# Patient Record
Sex: Male | Born: 1980 | Hispanic: Yes | Marital: Married | Smoking: Never smoker
Health system: Southern US, Community
[De-identification: ages and names within clinical notes are randomized; demographics above are authoritative.]

## PROBLEM LIST (undated history)

## (undated) DIAGNOSIS — Z789 Other specified health status: Secondary | ICD-10-CM

## (undated) HISTORY — PX: INGUINAL HERNIA REPAIR: SHX194

## (undated) HISTORY — DX: Other specified health status: Z78.9

## (undated) HISTORY — PX: NO PAST SURGERIES: SHX2092

---

## 2017-08-08 ENCOUNTER — Other Ambulatory Visit: Payer: Self-pay

## 2017-08-08 ENCOUNTER — Emergency Department (INDEPENDENT_AMBULATORY_CARE_PROVIDER_SITE_OTHER)
Admission: EM | Admit: 2017-08-08 | Discharge: 2017-08-08 | Disposition: A | Discharge status: Home / Self Care | Payer: Managed Care, Other (non HMO) | Source: Home / Self Care | Attending: Family Medicine | Admitting: Family Medicine

## 2017-08-08 ENCOUNTER — Encounter: Payer: Self-pay | Admitting: *Deleted

## 2017-08-08 DIAGNOSIS — J069 Acute upper respiratory infection, unspecified: Secondary | ICD-10-CM

## 2017-08-08 DIAGNOSIS — B9789 Other viral agents as the cause of diseases classified elsewhere: Secondary | ICD-10-CM | POA: Diagnosis not present

## 2017-08-08 MED ORDER — BENZONATATE 200 MG PO CAPS: ORAL_CAPSULE | ORAL | 0 refills | Status: DC

## 2017-08-08 MED ORDER — AZITHROMYCIN 250 MG PO TABS: ORAL_TABLET | ORAL | 0 refills | Status: DC

## 2017-08-08 NOTE — Discharge Instructions (Signed)
Take plain guaifenesin (1200mg  extended release tabs such as Mucinex) twice daily, with plenty of water, for cough and congestion.  May add Pseudoephedrine (30mg , one or two every 4 to 6 hours) for sinus congestion.  Get adequate rest.   May use Afrin nasal spray (or generic oxymetazoline) each morning for about 5 days and then discontinue.  Also recommend using saline nasal spray several times daily and saline nasal irrigation (AYR is a common brand).  Use Flonase nasal spray each morning after using Afrin nasal spray and saline nasal irrigation. Try warm salt water gargles for sore throat.  Stop all antihistamines for now, and other non-prescription cough/cold preparations. May take Ibuprofen 200mg , 4 tabs every 8 hours with food for headache, body aches, fever, etc. Begin Azithromycin if not improving about one week or if persistent fever develops (Given a prescription to hold, with an expiration date)  Follow-up with family doctor if not improving about10 days.

## 2017-08-08 NOTE — ED Provider Notes (Signed)
[facility]    CSN: [id number] Arrival date & time: 08/08/17  1225     History   Chief Complaint Chief Complaint  Patient presents with  . Cough    HPI Victor Sherman is a 36 y.o. male.   Yesterday patient developed typical cold-like symptoms including mild sore throat, sinus congestion, headache, fatigue, chills, and cough.    The history is provided by the patient.    History reviewed. No pertinent past medical history.  There are no active problems to display for this patient.   History reviewed. No pertinent surgical history.     Home Medications    Prior to Admission medications   Medication Sig Start Date End Date Taking? Authorizing Provider  azithromycin (ZITHROMAX Z-PAK) 250 MG tablet Take 2 tabs today; then begin one tab once daily for 4 more days. (Rx void after 08/16/17). 08/08/17   Lattie HawBeese, Stephen A, MD  benzonatate (TESSALON) 200 MG capsule Take one cap by mouth at bedtime as needed for cough.  May repeat in 4 to 6 hours 08/08/17   Lattie HawBeese, Stephen A, MD    Family History History reviewed. No pertinent family history.  Social History Social History   Tobacco Use  . Smoking status: Never Smoker  . Smokeless tobacco: Never Used  Substance Use Topics  . Alcohol use: Yes    Comment: 2-3 q wk  . Drug use: No     Allergies   Patient has no known allergies.   Review of Systems Review of Systems + sore throat + cough No pleuritic pain No wheezing + nasal congestion + post-nasal drainage No sinus pain/pressure No itchy/red eyes No earache No hemoptysis No SOB No fever, + chills No nausea No vomiting No abdominal pain No diarrhea No urinary symptoms No skin rash + fatigue No myalgias No headache Used OTC meds without relief   Physical Exam Triage Vital Signs ED Triage Vitals  Enc Vitals Group     BP 08/08/17 1253 117/70     Pulse Rate 08/08/17 1253 70     Resp 08/08/17 1253 16     Temp 08/08/17 1253 97.7  F (36.5 C)     Temp Source 08/08/17 1253 Oral     SpO2 08/08/17 1253 100 %     Weight 08/08/17 1254 177 lb (80.3 kg)     Height 08/08/17 1254 5\' 7"  (1.702 m)     Head Circumference --      Peak Flow --      Pain Score 08/08/17 1254 0     Pain Loc --      Pain Edu? --      Excl. in [place]? --    No data found.  Updated Vital Signs BP 117/70 (BP Location: Left Arm)   Pulse 70   Temp 97.7 F (36.5 C) (Oral)   Resp 16   Ht 5\' 7"  (1.702 m)   Wt 177 lb (80.3 kg)   SpO2 100%   BMI 27.72 kg/m   Visual Acuity Right Eye Distance:   Left Eye Distance:   Bilateral Distance:    Right Eye Near:   Left Eye Near:    Bilateral Near:     Physical Exam Nursing notes and Vital Signs reviewed. Appearance:  Patient appears stated age, and in no acute distress Eyes:  Pupils are equal, round, and reactive to light and accomodation.  Extraocular movement is intact.  Conjunctivae are not inflamed  Ears:  Canals normal.  Tympanic membranes normal.  Nose:  Mildly congested turbinates.  No sinus tenderness.    Pharynx:  Normal Neck:  Supple.  Enlarged posterior/lateral nodes are palpated bilaterally, tender to palpation on the left.   Lungs:  Clear to auscultation.  Breath sounds are equal.  Moving air well. Heart:  Regular rate and rhythm without murmurs, rubs, or gallops.  Abdomen:  Nontender without masses or hepatosplenomegaly.  Bowel sounds are present.  No CVA or flank tenderness.  Extremities:  No edema.  Skin:  No rash present.    UC Treatments / Results  Labs (all labs ordered are listed, but only abnormal results are displayed) Labs Reviewed - No data to display  EKG  EKG Interpretation None       Radiology No results found.  Procedures Procedures (including critical care time)  Medications Ordered in UC Medications - No data to display   Initial Impression / Assessment and Plan / UC Course  I have reviewed the triage vital signs and the nursing notes.  Pertinent  labs & imaging results that were available during my care of the patient were reviewed by me and considered in my medical decision making (see chart for details).    There is no evidence of bacterial infection today.  Prescription written for Benzonatate [facility](Tessalon) to take at bedtime for night-time cough.  Take plain guaifenesin (1200mg  extended release tabs such as Mucinex) twice daily, with plenty of water, for cough and congestion.  May add Pseudoephedrine (30mg , one or two every 4 to 6 hours) for sinus congestion.  Get adequate rest.   May use Afrin nasal spray (or generic oxymetazoline) each morning for about 5 days and then discontinue.  Also recommend using saline nasal spray several times daily and saline nasal irrigation (AYR is a common brand).  Use Flonase nasal spray each morning after using Afrin nasal spray and saline nasal irrigation. Try warm salt water gargles for sore throat.  Stop all antihistamines for now, and other non-prescription cough/cold preparations. May take Ibuprofen 200mg , 4 tabs every 8 hours with food for headache, body aches, fever, etc. Begin Azithromycin if not improving about one week or if persistent fever develops (Given a prescription to hold, with an expiration date)  Follow-up with family doctor if not improving about10 days.     Final Clinical Impressions(s) / UC Diagnoses   Final diagnoses:  Viral URI with cough    ED Discharge Orders        Ordered    azithromycin (ZITHROMAX Z-PAK) 250 MG tablet     08/08/17 1319    benzonatate (TESSALON) 200 MG capsule     08/08/17 1320           Lattie HawBeese, Stephen A, MD 08/11/17 1119

## 2017-08-08 NOTE — ED Triage Notes (Signed)
Pt c/o productive cough x 2 days.  

## 2019-02-13 ENCOUNTER — Encounter: Payer: Self-pay | Admitting: Sports Medicine

## 2019-02-13 ENCOUNTER — Other Ambulatory Visit: Payer: Self-pay

## 2019-02-13 ENCOUNTER — Ambulatory Visit (INDEPENDENT_AMBULATORY_CARE_PROVIDER_SITE_OTHER): Payer: Managed Care, Other (non HMO)

## 2019-02-13 ENCOUNTER — Ambulatory Visit (INDEPENDENT_AMBULATORY_CARE_PROVIDER_SITE_OTHER): Payer: Managed Care, Other (non HMO) | Admitting: Sports Medicine

## 2019-02-13 ENCOUNTER — Telehealth: Payer: Self-pay | Admitting: General Practice

## 2019-02-13 DIAGNOSIS — M25474 Effusion, right foot: Secondary | ICD-10-CM

## 2019-02-13 MED ORDER — MELOXICAM 15 MG PO TABS: ORAL_TABLET | ORAL | 3 refills | Status: DC

## 2019-02-13 MED ORDER — PREDNISONE 50 MG PO TABS: ORAL_TABLET | ORAL | 0 refills | Status: DC

## 2019-02-13 NOTE — Progress Notes (Signed)
Subjective:    CC: New patient visit with the below complaints as noted in HPI: right foot pain  HPI: Patient presents with 4 days of right foot pain located at the first MTP joint which began after taking a misstep. The pain is 8 out of 10, stabbing and constant when he walks or moves his toes and is decreased at rest. He has tried an icy-hot patch which helped with pain minimally. He denies fever or chills. He has never experienced this type of pain before in this area or any other joint. He admits eating a lot of meat before this happened, but denies heavy alcohol use.    I reviewed the past medical history, family history, social history, surgical history, and allergies today and no changes were needed.  Please see the problem list section below in epic for further details.  Past Medical History: Past Medical History:  Diagnosis Date  . No pertinent past medical history    Past Surgical History: Past Surgical History:  Procedure Laterality Date  . NO PAST SURGERIES     Social History: Social History   Socioeconomic History  . Marital status: Married    Spouse name: Not on file  . Number of children: Not on file  . Years of education: Not on file  . Highest education level: Not on file  Occupational History  . Not on file  Social Needs  . Financial resource strain: Not on file  . Food insecurity    Worry: Not on file    Inability: Not on file  . Transportation needs    Medical: Not on file    Non-medical: Not on file  Tobacco Use  . Smoking status: Never Smoker  . Smokeless tobacco: Never Used  Substance and Sexual Activity  . Alcohol use: Yes    Comment: 2-3 q wk  . Drug use: No  . Sexual activity: Not on file  Lifestyle  . Physical activity    Days per week: Not on file    Minutes per session: Not on file  . Stress: Not on file  Relationships  . Social Musicianconnections    Talks on phone: Not on file    Gets together: Not on file    Attends religious service:  Not on file    Active member of club or organization: Not on file    Attends meetings of clubs or organizations: Not on file    Relationship status: Not on file  Other Topics Concern  . Not on file  Social History Narrative  . Not on file   Family History: No family history on file. Allergies: No Known Allergies Medications: See med rec.  Review of Systems: No headache, visual changes, nausea, vomiting, diarrhea, constipation, dizziness, abdominal pain, skin rash, fevers, chills, night sweats, swollen lymph nodes, weight loss, chest pain, body aches, joint swelling, muscle aches, shortness of breath, mood changes, visual or auditory hallucinations.  Objective:    General: Well Developed, well nourished, and in no acute distress.  Neuro: Alert and oriented x3, extra-ocular muscles intact, sensation grossly intact.  HEENT: Normocephalic, atraumatic, pupils equal round reactive to light, neck supple, no masses, no lymphadenopathy, thyroid nonpalpable.  Skin: Warm and dry, no rashes noted.  Cardiac: Regular rate and rhythm, no murmurs rubs or gallops.  Respiratory: Clear to auscultation bilaterally. Not using accessory muscles, speaking in full sentences.  Abdominal: Soft, nontender, nondistended, positive bowel sounds, no masses, no organomegaly.  Right Foot: Mild erythema and swelling  proximal to first MTP. Moderate tenderness to palpation in this area. No tenderness at other MTP joints or phalanges, tarsals, or metatarsals.  Range of motion is full in all directions. Strength is 5/5 in all directions. No hallux valgus. No pes cavus or pes planus. No abnormal callus noted. No pain over the navicular prominence, or base of fifth metatarsal. No tenderness to palpation of the calcaneal insertion of plantar fascia. No pain at the Achilles insertion. No pain over the calcaneal bursa. No pain of the retrocalcaneal bursa. No hallux rigidus or limitus. No pain with compression of the  metatarsal heads. Neurovascularly intact distally.     Impression and Recommendations:    Assessment: Pain/swelling of first MTP joint of the right foot. Plan: Radiographs of R foot, Prednisone x 5 days followed by Meloxicam once/day, post-op shoe, serum uric acid levels. Will return in 1-2 weeks to re-evaluate.   The patient was counselled, risk factors were discussed, anticipatory guidance given.  Swelling of first metatarsophalangeal (MTP) joint of right foot Mild trauma, but also swelling in the MTP with a fairly large consumption of meat prior suspicious for gout. Adding serum uric acid levels, x-rays, postop shoe. Prednisone, meloxicam. Return to see me in 1 to 2 weeks.   ___________________________________________ Gwen Her. Dianah Field, M.D., ABFM., CAQSM. Primary Care and Sports Medicine [facility] [facility]  Adjunct Professor of [place] [employer]

## 2019-02-13 NOTE — Telephone Encounter (Signed)
Note written

## 2019-02-13 NOTE — Telephone Encounter (Signed)
Patient is needing a note for work. He has missed two days because of the injury. (6/21-22)  Please Advise.   When done we can email to patient.

## 2019-02-13 NOTE — Assessment & Plan Note (Signed)
Mild trauma, but also swelling in the MTP with a fairly large consumption of meat prior suspicious for gout. Adding serum uric acid levels, x-rays, postop shoe. Prednisone, meloxicam. Return to see me in 1 to 2 weeks.

## 2019-02-14 LAB — COMPLETE METABOLIC PANEL WITH GFR
AG Ratio: 1.6 (calc) (ref 1.0–2.5)
ALT: 37 U/L (ref 9–46)
AST: 28 U/L (ref 10–40)
Albumin: 4.1 g/dL (ref 3.6–5.1)
Alkaline phosphatase (APISO): 77 U/L (ref 36–130)
BUN: 11 mg/dL (ref 7–25)
CO2: 25 mmol/L (ref 20–32)
Calcium: 8.8 mg/dL (ref 8.6–10.3)
Chloride: 108 mmol/L (ref 98–110)
Creat: 0.74 mg/dL (ref 0.60–1.35)
GFR, Est African American: 135 mL/min/{1.73_m2} (ref >=60)
GFR, Est Non African American: 116 mL/min/{1.73_m2} (ref >=60)
Globulin: 2.5 g/dL (calc) (ref 1.9–3.7)
Glucose, Bld: 92 mg/dL (ref 65–139)
Potassium: 3.9 mmol/L (ref 3.5–5.3)
Sodium: 140 mmol/L (ref 135–146)
Total Bilirubin: 0.3 mg/dL (ref 0.2–1.2)
Total Protein: 6.6 g/dL (ref 6.1–8.1)

## 2019-02-14 LAB — URIC ACID: Uric Acid, Serum: 4.5 mg/dL (ref 4.0–8.0)

## 2019-02-27 ENCOUNTER — Ambulatory Visit: Payer: Managed Care, Other (non HMO) | Admitting: Sports Medicine

## 2019-11-26 ENCOUNTER — Emergency Department (INDEPENDENT_AMBULATORY_CARE_PROVIDER_SITE_OTHER)
Admission: EM | Admit: 2019-11-26 | Discharge: 2019-11-26 | Disposition: A | Discharge status: Home / Self Care | Payer: Managed Care, Other (non HMO) | Source: Home / Self Care | Attending: Family Medicine | Admitting: Family Medicine

## 2019-11-26 ENCOUNTER — Emergency Department (INDEPENDENT_AMBULATORY_CARE_PROVIDER_SITE_OTHER): Payer: Managed Care, Other (non HMO)

## 2019-11-26 ENCOUNTER — Other Ambulatory Visit: Payer: Self-pay

## 2019-11-26 DIAGNOSIS — S46912A Strain of unspecified muscle, fascia and tendon at shoulder and upper arm level, left arm, initial encounter: Secondary | ICD-10-CM

## 2019-11-26 DIAGNOSIS — T1490XA Injury, unspecified, initial encounter: Secondary | ICD-10-CM

## 2019-11-26 MED ORDER — ACETAMINOPHEN 325 MG PO TABS
650.0000 mg | ORAL_TABLET | Freq: Once | ORAL | Status: AC
  Administered 2019-11-26: 12:00:00 650 mg via ORAL

## 2019-11-26 NOTE — Discharge Instructions (Addendum)
Wear ace wrap on elbow until improved.  Apply ice pack for 20 to 30 minutes, 3 to 4 times daily  Continue until pain and swelling decrease.  May take Ibuprofen 200mg , 4 tabs every 8 hours with food.
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
## 2019-11-26 NOTE — ED Notes (Signed)
Ice pack given after triage.
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
## 2021-06-26 ENCOUNTER — Ambulatory Visit (INDEPENDENT_AMBULATORY_CARE_PROVIDER_SITE_OTHER): Payer: BC Managed Care – PPO | Admitting: Sports Medicine

## 2021-06-26 ENCOUNTER — Other Ambulatory Visit: Payer: Self-pay

## 2021-06-26 ENCOUNTER — Ambulatory Visit (INDEPENDENT_AMBULATORY_CARE_PROVIDER_SITE_OTHER): Payer: BC Managed Care – PPO

## 2021-06-26 DIAGNOSIS — M79674 Pain in right toe(s): Secondary | ICD-10-CM | POA: Diagnosis not present

## 2021-06-26 DIAGNOSIS — M79671 Pain in right foot: Secondary | ICD-10-CM

## 2021-06-26 DIAGNOSIS — M2011 Hallux valgus (acquired), right foot: Secondary | ICD-10-CM | POA: Diagnosis not present

## 2021-06-26 MED ORDER — MELOXICAM 15 MG PO TABS: ORAL_TABLET | ORAL | 3 refills | Status: DC

## 2021-06-26 NOTE — Progress Notes (Signed)
    Procedures performed today:    None.  Independent interpretation of notes and tests performed by another provider:   None.  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 He does have a very mild bunion. Tenderness is predominantly over the TMT, I did see him about 2 years ago and we check some uric acid levels which were normal. We will start conservatively, postop shoe, referral for custom orthotics with first MT ray posting, meloxicam, x-rays. We did talk about the limitations of bunion spacers, they do not affect the natural history of the bunion but can improve his pain to some degree.  He will probably get one off of [employer]. Return to see me in 2 to 4 weeks, will consider Morton's plate +/- TMT injection if no better.    ___________________________________________ Ihor Austin. Benjamin Stain, M.D., ABFM., CAQSM. Primary Care and Sports Medicine [place] [facility]  Adjunct Instructor of Family Medicine  [employer]

## 2021-06-26 NOTE — Assessment & Plan Note (Addendum)
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 He does have a very mild bunion. Tenderness is predominantly over the TMT, I did see him about 2 years ago and we check some uric acid levels which were normal. We will start conservatively, postop shoe, referral for custom orthotics with first MT ray posting, meloxicam, x-rays. We did talk about the limitations of bunion spacers, they do not affect the natural history of the bunion but can improve his pain to some degree.  He will probably get one off of [employer]. Return to see me in 2 to 4 weeks, will consider Morton's plate +/- TMT injection if no better.

## 2021-07-24 ENCOUNTER — Ambulatory Visit: Payer: BC Managed Care – PPO | Admitting: Sports Medicine

## 2021-07-24 ENCOUNTER — Ambulatory Visit: Payer: BC Managed Care – PPO | Admitting: Family Medicine

## 2021-08-28 ENCOUNTER — Encounter: Payer: BC Managed Care – PPO | Admitting: Family Medicine

## 2022-06-12 IMAGING — DX DG FOOT COMPLETE 3+V*R*
3 series · 3 of 3 positions shown · non-contrast
Comparison: None.

CLINICAL DATA: First toe pain

EXAM:
RIGHT FOOT COMPLETE - 3+ VIEW

[foot ap]
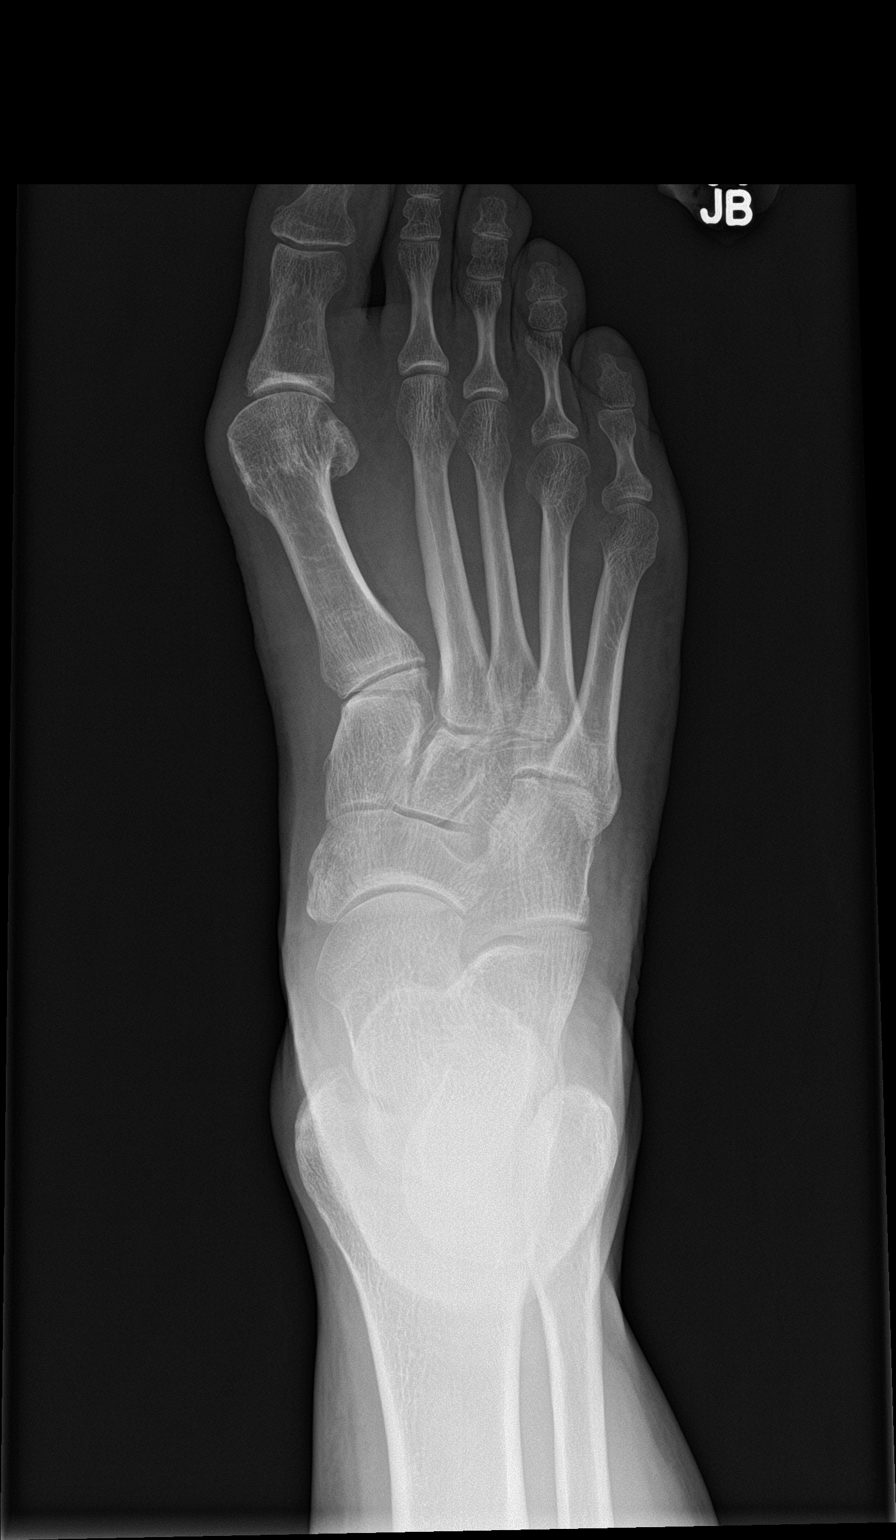

[foot obl]
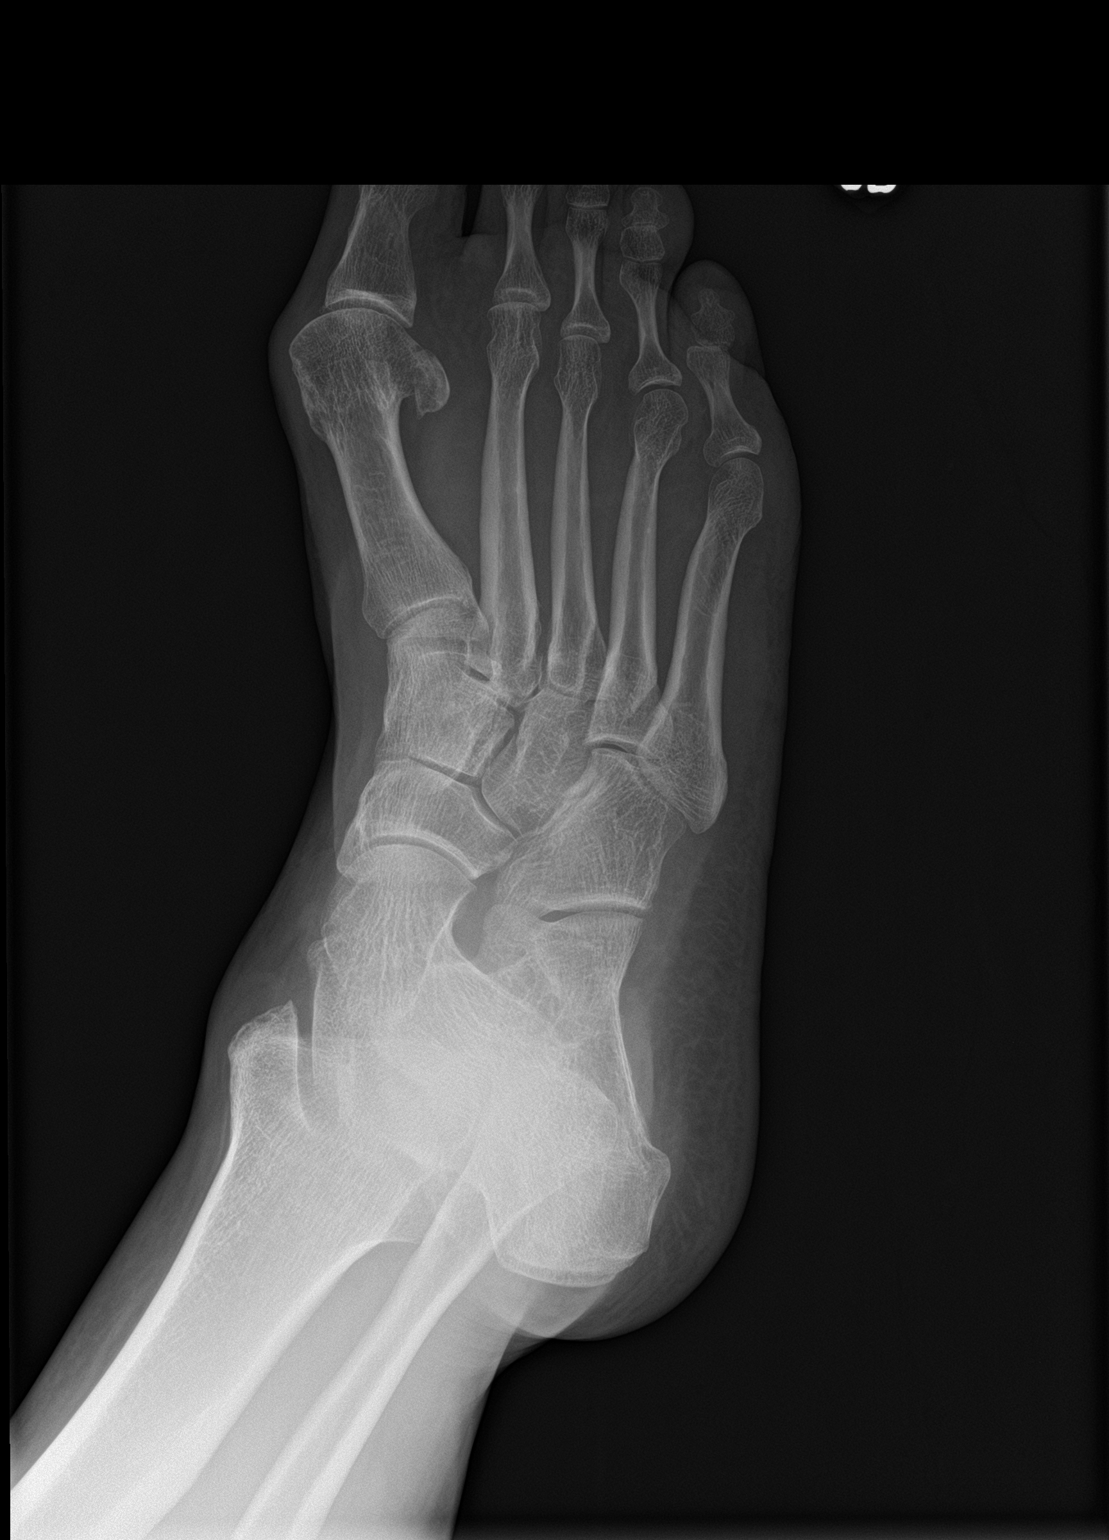

[foot lat]
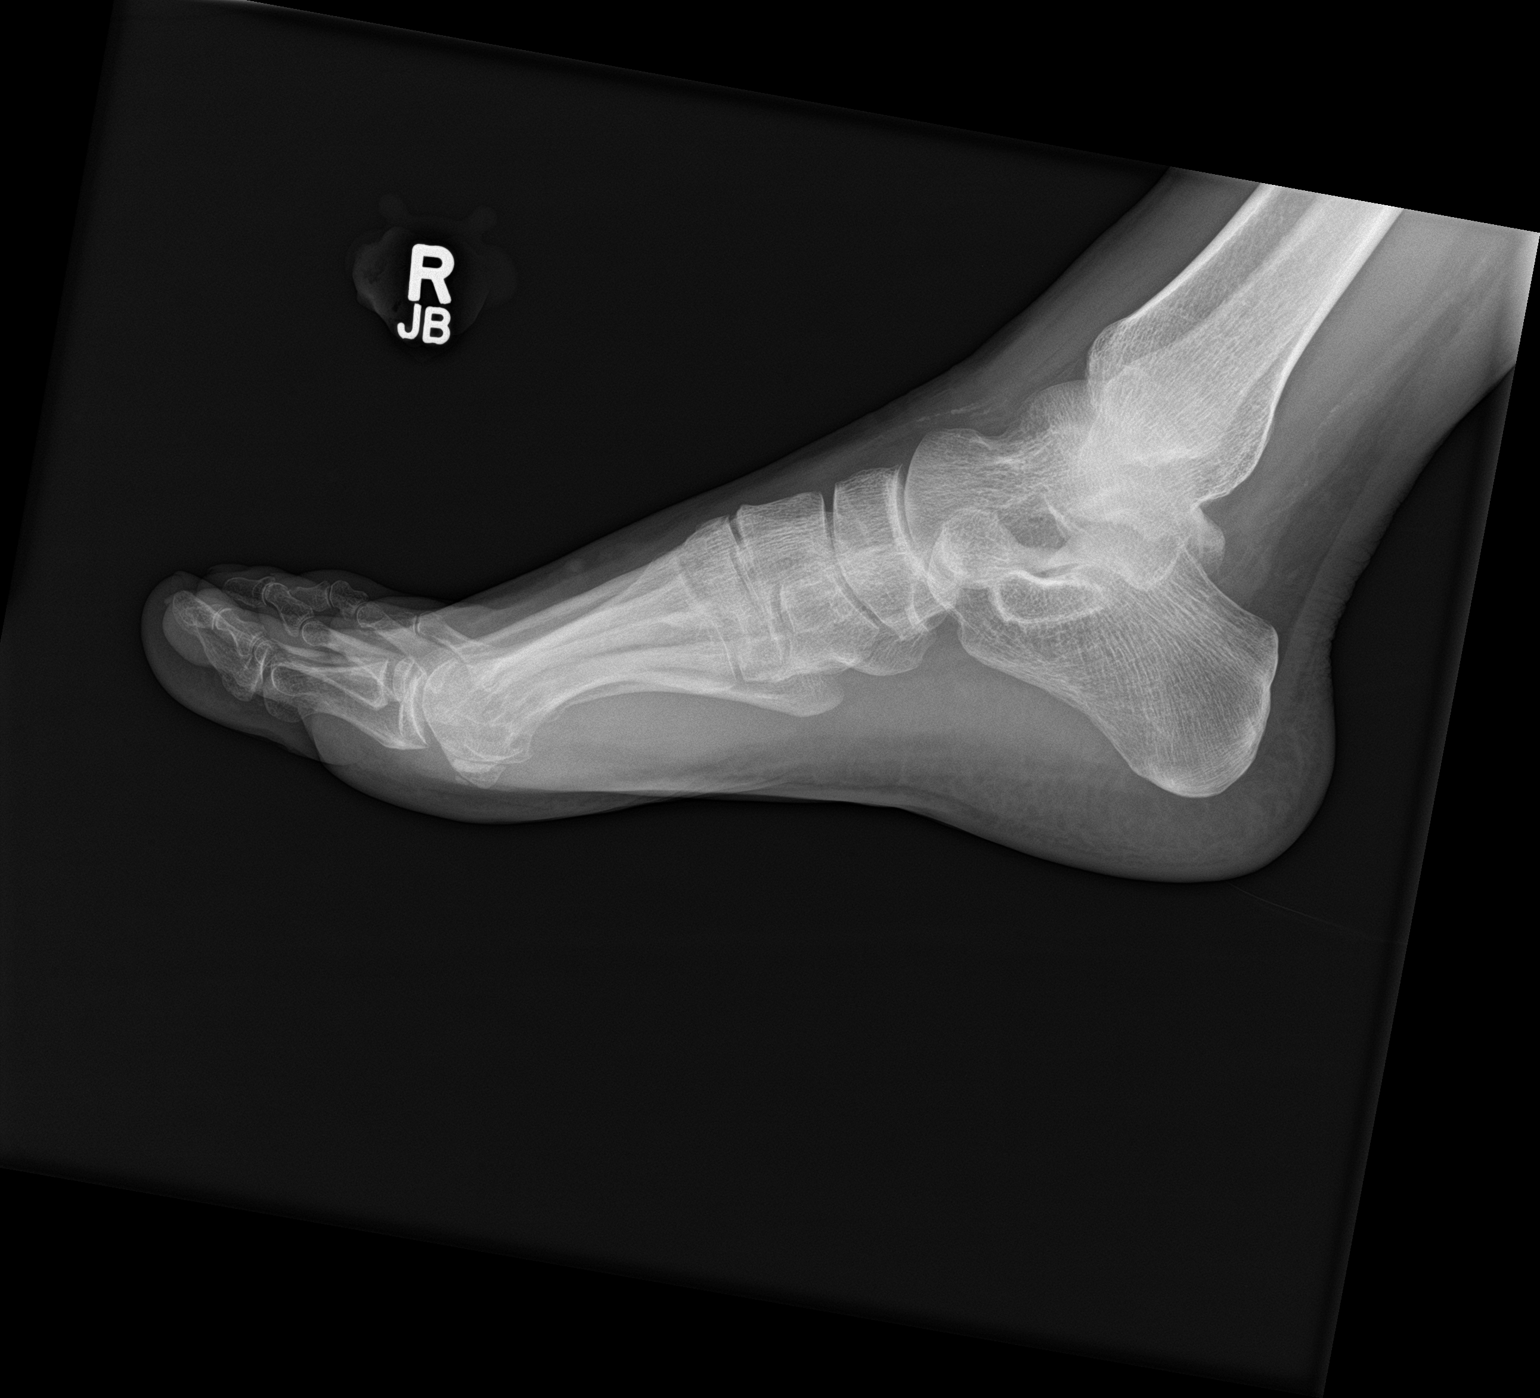

[3 of 3 positions shown; findings below may reference images not displayed]

FINDINGS: Mild hallux valgus deformity is noted. No acute fracture or
dislocation is noted. No soft tissue abnormality is seen.
IMPRESSION: No acute abnormality noted.

## 2022-08-10 DIAGNOSIS — J069 Acute upper respiratory infection, unspecified: Secondary | ICD-10-CM | POA: Diagnosis not present

## 2022-08-10 DIAGNOSIS — U071 COVID-19: Secondary | ICD-10-CM | POA: Diagnosis not present

## 2022-08-13 DIAGNOSIS — K4020 Bilateral inguinal hernia, without obstruction or gangrene, not specified as recurrent: Secondary | ICD-10-CM | POA: Diagnosis not present

## 2022-12-07 ENCOUNTER — Encounter: Payer: Self-pay | Admitting: *Deleted

## 2022-12-16 ENCOUNTER — Encounter: Payer: Self-pay | Admitting: Family Medicine

## 2022-12-16 ENCOUNTER — Other Ambulatory Visit: Payer: Self-pay | Admitting: Family Medicine

## 2022-12-16 ENCOUNTER — Ambulatory Visit (INDEPENDENT_AMBULATORY_CARE_PROVIDER_SITE_OTHER): Payer: BC Managed Care – PPO

## 2022-12-16 ENCOUNTER — Ambulatory Visit (INDEPENDENT_AMBULATORY_CARE_PROVIDER_SITE_OTHER): Payer: BC Managed Care – PPO | Admitting: Family Medicine

## 2022-12-16 VITALS — BP 148/81 | HR 77 | Ht 67.0 in | Wt 211.0 lb

## 2022-12-16 DIAGNOSIS — R1909 Other intra-abdominal and pelvic swelling, mass and lump: Secondary | ICD-10-CM | POA: Diagnosis not present

## 2022-12-16 NOTE — Assessment & Plan Note (Signed)
Pleasant 42 year old Spanish speaking patient presents to establish care.  He has concerns today of bilateral inguinal swelling.  He did have inguinal hernia repair surgery done in [place] this past December 2023.  On exam I do note swelling in the inguinal region bilaterally.  We will go ahead and obtain ultrasound to rule out seroma.  Medical assistant was able to call down to our imaging center and get the patient an appointment for ultrasound today send Spanish interpreter is present.

## 2022-12-16 NOTE — Progress Notes (Signed)
Established patient visit   Patient: Victor Sherman   DOB: [birth year]   42 y.o. Male  MRN: [id number] Visit Date: 12/16/2022  Today's healthcare provider: Charlton Amor, DO   Chief Complaint  Patient presents with   Establish Care    SUBJECTIVE    Chief Complaint  Patient presents with   Establish Care   HPI  He had surgery in [place] for two inguinal hernias. He talked to the surgeon in [place] who said the swelling has not improved. He said the hernia repair was from   He notes some lower pelvic discomfort and swelling. He says it has not changes with food. Denies pain with movmeent does note he feels fluid in his groin area when he walks.  Review of Systems  Constitutional:  Negative for activity change, fatigue and fever.  Respiratory:  Negative for cough and shortness of breath.   Cardiovascular:  Negative for chest pain.  Gastrointestinal:  Negative for abdominal pain.  Genitourinary:  Negative for difficulty urinating.       No outpatient medications have been marked as taking for the 12/16/22 encounter (Office Visit) with Charlton Amor, DO.    OBJECTIVE    BP (!) 148/81   Pulse 77   Ht  (1.702 m)   Wt 211 lb (95.7 kg)   SpO2 99%   BMI 33.05 kg/m   Physical Exam Vitals and nursing note reviewed.  Constitutional:      General: He is not in acute distress.    Appearance: Normal appearance.  HENT:     Head: Normocephalic and atraumatic.     Right Ear: External ear normal.     Left Ear: External ear normal.     Nose: Nose normal.  Eyes:     Conjunctiva/sclera: Conjunctivae normal.  Cardiovascular:     Rate and Rhythm: Normal rate.  Pulmonary:     Effort: Pulmonary effort is normal.  Genitourinary:    Comments: Inguinal swelling bilaterally with fluid feeling underneath. Tender to palpation Neurological:     General: No focal deficit present.     Mental Status: He is alert and oriented to person, place, and time.  Psychiatric:         Mood and Affect: Mood normal.        Behavior: Behavior normal.        Thought Content: Thought content normal.        Judgment: Judgment normal.       ASSESSMENT & PLAN    Problem List Items Addressed This Visit       Other   Inguinal swelling - Primary    Pleasant 42 year old Spanish speaking patient presents to establish care.  He has concerns today of bilateral inguinal swelling.  He did have inguinal hernia repair surgery done in [place] this past December 2023.  On exam I do note swelling in the inguinal region bilaterally.  We will go ahead and obtain ultrasound to rule out seroma.  Medical assistant was able to call down to our imaging center and get the patient an appointment for ultrasound today send Spanish interpreter is present.      Relevant Orders   US PELVIS (TRANSABDOMINAL ONLY)    Return if symptoms worsen or fail to improve.      No orders of the defined types were placed in this encounter.   Orders Placed This Encounter  Procedures   US PELVIS (TRANSABDOMINAL ONLY)    Standing Status:  Future    Number of Occurrences:   1    Standing Expiration Date:   12/16/2023    Order Specific Question:   Reason for exam:    Answer:   inguinal hernia repair in [place] with swelling r/o seroma    Order Specific Question:   Preferred imaging location?    Answer:   [facility], DO  [facility] at [facility] [phone] (phone) [phone] (fax)  [facility]

## 2022-12-31 ENCOUNTER — Ambulatory Visit (INDEPENDENT_AMBULATORY_CARE_PROVIDER_SITE_OTHER): Payer: BC Managed Care – PPO

## 2022-12-31 DIAGNOSIS — R1909 Other intra-abdominal and pelvic swelling, mass and lump: Secondary | ICD-10-CM

## 2022-12-31 DIAGNOSIS — R109 Unspecified abdominal pain: Secondary | ICD-10-CM | POA: Diagnosis not present

## 2022-12-31 MED ORDER — IOHEXOL 300 MG/ML  SOLN
100.0000 mL | Freq: Once | INTRAMUSCULAR | Status: AC | PRN
  Administered 2022-12-31: 100 mL via INTRAVENOUS

## 2023-12-29 ENCOUNTER — Encounter: Payer: Self-pay | Admitting: Family Medicine

## 2024-04-24 ENCOUNTER — Encounter: Payer: Self-pay | Admitting: Sports Medicine

## 2024-05-14 ENCOUNTER — Ambulatory Visit: Admitting: Family Medicine

## 2024-05-14 ENCOUNTER — Ambulatory Visit: Payer: Self-pay

## 2024-05-14 VITALS — BP 136/90 | HR 72 | Ht 67.0 in | Wt 208.0 lb

## 2024-05-14 DIAGNOSIS — B029 Zoster without complications: Secondary | ICD-10-CM | POA: Diagnosis not present

## 2024-05-14 MED ORDER — VALACYCLOVIR HCL 1 G PO TABS: 1000.0000 mg | ORAL_TABLET | Freq: Three times a day (TID) | ORAL | 0 refills | Status: AC

## 2024-05-14 NOTE — Progress Notes (Signed)
 Victor Sherman - 43 y.o. male MRN [id number]  Date of birth: 1981/08/03  Subjective Chief Complaint  Patient presents with   Herpes Zoster    HPI [facility] Victor Sherman is a 43 y.o. male here today with complaint of rash.  Rash appeared yesterday but he has some achy feeling in his low back and numb feeling preceding this.  Located on the R side of the lower back.  He has pain with this.  Denies drainage from lesions.  He has not changed any laundry or bathing products.  Denies fever or chills.   ROS:  A comprehensive ROS was completed and negative except as noted per HPI  No Known Allergies  Past Medical History:  Diagnosis Date   No pertinent past medical history     Past Surgical History:  Procedure Laterality Date   NO PAST SURGERIES      Social History   Socioeconomic History   Marital status: Married    Spouse name: Not on file   Number of children: Not on file   Years of education: Not on file   Highest education level: GED or equivalent  Occupational History   Not on file  Tobacco Use   Smoking status: Never   Smokeless tobacco: Never  Vaping Use   Vaping status: Never Used  Substance and Sexual Activity   Alcohol use: Yes    Comment: 2-3 q wk   Drug use: No   Sexual activity: Not on file  Other Topics Concern   Not on file  Social History Narrative   Not on file   Social Drivers of Health   Financial Resource Strain: Low Risk  (05/14/2024)   Overall Financial Resource Strain (CARDIA)    Difficulty of Paying Living Expenses: Not hard at all  Food Insecurity: No Food Insecurity (05/14/2024)   Hunger Vital Sign    Worried About Running Out of Food in the Last Year: Never true    Ran Out of Food in the Last Year: Never true  Transportation Needs: No Transportation Needs (05/14/2024)   PRAPARE - Administrator, Civil Service (Medical): No    Lack of Transportation (Non-Medical): No  Physical Activity: Inactive (05/14/2024)   Exercise Vital  Sign    Days of Exercise per Week: 0 days    Minutes of Exercise per Session: Not on file  Stress: No Stress Concern Present (05/14/2024)   [employer] of Occupational Health - Occupational Stress Questionnaire    Feeling of Stress: Not at all  Social Connections: Socially Integrated (05/14/2024)   Social Connection and Isolation Panel    Frequency of Communication with Friends and Family: More than three times a week    Frequency of Social Gatherings with Friends and Family: Twice a week    Attends Religious Services: More than 4 times per year    Active Member of [employer] or Organizations: Yes    Attends Engineer, structural: More than 4 times per year    Marital Status: Married    No family history on file.  Health Maintenance  Topic Date Due   HIV Screening  Never done   Hepatitis C Screening  Never done   Hepatitis B Vaccines 19-59 Average Risk (1 of 3 - 19+ 3-dose series) Never done   HPV VACCINES (1 - 3-dose SCDM series) Never done   Influenza Vaccine  Never done   COVID-19 Vaccine (3 - 2025-26 season) 04/23/2024   DTaP/Tdap/Td (2 - Td  or Tdap) 03/09/2034   Pneumococcal Vaccine  Aged Out   Meningococcal B Vaccine  Aged Out     ----------------------------------------------------------------------------------------------------------------------------------------------------------------------------------------------------------------- Physical Exam BP (!) 136/90 (BP Location: Left Arm, Patient Position: Sitting, Cuff Size: Normal)   Pulse 72   Ht 5' 7 (1.702 m)   Wt 208 lb (94.3 kg)   SpO2 97%   BMI 32.58 kg/m   Physical Exam Constitutional:      Appearance: Normal appearance.  HENT:     Head: Normocephalic and atraumatic.  Skin:    Comments: Erythematous papules around the R lower back and flank.  A couple of small vesicles noted. There are 1-2 spots that cross the midline but the vast majority are concentrated on the R side along L1 distribution.    Neurological:     Mental Status: He is alert.     ------------------------------------------------------------------------------------------------------------------------------------------------------------------------------------------------------------------- Assessment and Plan  Shingles Treating with 7 day course of valtrex  1g TID.  Precautions and red flags reviewed.  Reasons for follow up discussed.     Meds ordered this encounter  Medications   valACYclovir  (VALTREX ) 1000 MG tablet    Sig: Take 1 tablet (1,000 mg total) by mouth 3 (three) times daily for 7 days.    Dispense:  21 tablet    Refill:  0    No follow-ups on file.

## 2024-05-14 NOTE — Assessment & Plan Note (Signed)
 Treating with 7 day course of valtrex  1g TID.  Precautions and red flags reviewed.  Reasons for follow up discussed.

## 2024-05-14 NOTE — Patient Instructions (Signed)
 Culebrilla  Shingles    La culebrilla, o herpes zster, es una infeccin. Produce una erupcin cutnea y ampollas. Estas zonas infectadas pueden doler mucho.  La culebrilla solo ocurre si:  Ha tenido varicela.  Le han aplicado una inyeccin llamada vacuna para protegerlo de la varicela. La culebrilla es poco frecuente en este caso.  Cules son las causas?  La culebrilla es causada por un germen llamado virus de la varicela zster. Este es el mismo germen que causa la varicela. Despus de estar expuesto al germen, este permanece en el cuerpo, pero est latente. Esto significa que no est activo.  La culebrilla se produce si el germen se vuelve activo nuevamente. Esto puede ocurrir aos despus de la primera exposicin al germen.  Qu incrementa el riesgo?  Es ms probable que tenga culebrilla si:  Es mayor de 60 aos.  Est bajo mucho estrs.  Tiene un sistema inmunitario dbil. El sistema inmunitario es el sistema de defensa de su cuerpo. Puede estar dbil si la persona:  Tiene el virus de inmunodeficiencia humana (VIH).  Tiene sndrome de inmunodeficiencia adquirida (sida).  Tiene cncer.  Toma medicamentos que debilitan el sistema inmunitario. Estos incluyen los medicamentos para el trasplante de rganos.  Cules son los signos o sntomas?  Los primeros sntomas de la culebrilla pueden ser picazn, hormigueo o Engineer, mining. Es posible que sienta que le arde la piel.  Unos das o semanas ms tarde, tendr una erupcin cutnea. Esto es lo que puede esperar:  Es probable que la erupcin aparezca en un solo lado del cuerpo.  La erupcin puede tener forma de cinturn o cinta. Con el tiempo, se convertir en ampollas llenas de lquido.  Las ampollas se rompern y se Forensic scientist.  Las costras se secarn en unas 2 o 3 semanas.  Tambin puede tener:  Lajune.  Escalofros.  Dolor de cabeza.  Nuseas.  Cmo se diagnostica?  La culebrilla se diagnostica con un examen de la piel. Se puede tomar una muestra llamada  cultivo de una de las ampollas y enviarla a un laboratorio. Esto indicar si tiene culebrilla.  Cmo se trata?  La erupcin cutnea puede durar varias semanas. No hay cura para la culebrilla, pero el mdico puede darle medicamentos. Estos medicamentos pueden hacer lo siguiente:  Ayudar a Engineer, materials.  Ayudar a la picazn.  Ayudar con la irritacin y la hinchazn.  Lograr una mejora ms pronto.  Prevenir problemas a largo plazo.  Si la erupcin est en la cara, es posible que deba consultar a un [place] o a un mdico especialista en nariz, garganta y odo (otorrinolaringlogo).  Siga estas instrucciones en su casa:  Medicamentos  Use los medicamentos solamente como se lo haya indicado el mdico.  Use una crema para calmar la picazn o cremas anestsicas en la erupcin cutnea o las ampollas segn las indicaciones del mdico.  Para aliviar la picazn y las molestias    Para ayudar a la picazn:  Coloque paos fros y hmedos, llamados compresas fras, sobre la erupcin cutnea o las ampollas.  Tome un bao fro. Pruebe agregar bicarbonato de sodio o avena seca en el agua. No se bae con agua caliente.  Use una locin de calamina en la erupcin cutnea o las ampollas. Puede conseguir este tipo de locin en la tienda.  Cuidado de las ampollas y la erupcin cutnea  Mantenga la zona de la erupcin cutnea cubierta con una venda floja.  Use ropa holgada que no roce contra  la erupcin.  Cudese la erupcin cutnea como se lo haya indicado el mdico. Asegrese de hacer lo siguiente:  Lvese las manos con agua y jabn durante al menos 20 segundos antes y despus de cambiarse la venda. Si no dispone de [place] y [place], use un desinfectante para manos.  Mantenga la erupcin cutnea y las ampollas limpias lavando la zona con jabn suave y agua fra.  Cmbiese la venda.  Verifique la erupcin cutnea todos los das para detectar signos de infeccin. Est atento a los siguientes signos:  Aumento del enrojecimiento, la  hinchazn o Chief Technology Officer.  Lquido o sangre.  Calor.  Pus o mal olor.  No se rasque el rea de la erupcin. No se toque las ampollas. Para evitar rascarse:  Tenga las uas siempre cortas y limpias.  Trate de usar guantes o [employer] duerme.  Instrucciones generales  Descanso.  Lvese las manos frecuentemente con agua y jabn durante al menos 20 segundos. Si no dispone de [place] y [place], use un desinfectante para manos. Lavarse las manos reduce la probabilidad de contraer una infeccin en la piel.  Su infeccin puede causar varicela en otras personas. Si tiene [employer] an no son costras, euel spangle de:  Los bebs.  Las [place].  Los nios con eczema.  Personas de edad avanzada que tienen un trasplante de rgano.  Las [employer] tienen una enfermedad de larga duracin, o Engineering geologist.  Las personas que no hayan tenido varicela antes.  Las personas que no hayan recibido la vacuna Transport planner.  Cmo se previene?  Las vacunas son la Geneticist, molecular de evitar que contraiga varicela o culebrilla. Hable con su mdico sobre la aplicacin de estas vacunas.  Dnde obtener ms informacin  Centers for Disease Control and Prevention Insurance claims handler) (Centros para el Control y la Prevencin de Event organiser): [email or web address].br  Comunquese con un mdico si:  El dolor no mejora con los medicamentos.  El dolor no mejora despus de que se cura la erupcin cutnea.  Tiene signos de infeccin alrededor de la erupcin cutnea.  La erupcin cutnea o las ampollas empeoran.  Tiene fiebre o escalofros.  Solicite ayuda de inmediato si:  La erupcin cutnea est en el rostro o en la nariz.  Tiene dolor en el rostro o cerca de los ojos.  Pierde la sensacin en un lado del rostro.  Tiene dificultad para ver.  Siente dolor o zumbido en el odo.  Esta informacin no tiene Theme park manager el consejo del mdico. Asegrese de hacerle al mdico cualquier pregunta que tenga.  Document Revised: 11/18/2022 Document Reviewed:  11/18/2022  Elsevier Patient Education  2024 [employer].

## 2024-05-14 NOTE — Telephone Encounter (Signed)
 FYI Only or Action Required?: Action required by provider: request for appointment.  Patient was last seen in primary care on 12/16/2022 by Bevin Bernice RAMAN, DO.  Called Nurse Triage reporting Herpes Zoster.  Symptoms began yesterday.  Interventions attempted: Nothing.  Symptoms are: unchanged.  Triage Disposition: See Physician Within 24 Hours  Patient/caregiver understands and will follow disposition?: YesCopied from CRM #[id number]. Topic: Clinical - Red Word Triage >> May 14, 2024 10:47 AM Antonio DEL wrote: Red Word that prompted transfer to Nurse Triage: Patient was told by a nurse that he has shingles on yesterday. Experiencing pain, redness, and burning on his back.Wife Nena is on the call, who is also on DPR. Reason for Disposition  [1] Shingles rash (matches SYMPTOMS) AND [2] onset < 72 hours ago (3 days)  Answer Assessment - Initial Assessment Questions 1. APPEARANCE of RASH: What does the rash look like?      red 2. LOCATION: Where is the rash located?      Back  3. ONSET: When did the rash start?      Yesterday  4. ITCHING: Does the rash itch? If Yes, ask: How bad is the itch?  (Scale 1-10; or mild, moderate, severe)     mild 5. PAIN: Does the rash hurt? If Yes, ask: How bad is the pain?  (Scale 0-10; or none, mild, moderate, severe)     Mild-moderate 6. OTHER SYMPTOMS: Do you have any other symptoms? (e.g., fever)     burning  Protocols used: Shingles (Zoster)-A-AH

## 2024-05-14 NOTE — Telephone Encounter (Signed)
 Patient scheduled for today

## 2024-06-28 ENCOUNTER — Ambulatory Visit: Admitting: Urgent Care

## 2024-06-28 ENCOUNTER — Encounter: Payer: Self-pay | Admitting: Urgent Care

## 2024-06-28 VITALS — BP 120/71 | HR 70 | Temp 98.3°F | Ht 67.0 in | Wt 208.0 lb

## 2024-06-28 DIAGNOSIS — Z0000 Encounter for general adult medical examination without abnormal findings: Secondary | ICD-10-CM | POA: Diagnosis not present

## 2024-06-28 DIAGNOSIS — Z23 Encounter for immunization: Secondary | ICD-10-CM

## 2024-06-28 DIAGNOSIS — Z1159 Encounter for screening for other viral diseases: Secondary | ICD-10-CM

## 2024-06-28 DIAGNOSIS — Z114 Encounter for screening for human immunodeficiency virus [HIV]: Secondary | ICD-10-CM

## 2024-06-28 DIAGNOSIS — Z6832 Body mass index (BMI) 32.0-32.9, adult: Secondary | ICD-10-CM | POA: Diagnosis not present

## 2024-06-28 NOTE — Progress Notes (Unsigned)
 Complete physical exam  Patient: Victor Sherman   DOB: 1981/01/09   43 y.o. Male  MRN: [id number]  Subjective:    Chief Complaint  Patient presents with   Establish Care    [facility] Sherman is a 43 y.o. male who presents today for a complete physical exam. He reports consuming a general diet. 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 He generally feels well. He reports sleeping fairly well. He does not have additional problems to discuss today.   Discussed the use of AI scribe software for clinical note transcription with the patient, who gave verbal consent to proceed.  History of Present Illness   Victor Sherman is a 43 year old male who presents for an annual physical exam and concerns about fluctuating blood sugar levels.  He has noticed fluctuations in his blood sugar levels. He did not have any labs done during his last visit with his previous doctor, but a general lab workup two years ago showed normal levels. He denies any history of high cholesterol and has no known family history of high cholesterol or heart disease. He sometimes feels tired and sleeps five to six hours a night, with variable ease of falling asleep. He consumes a diet that includes pasta, salad, chicken, yogurt, granola, fruit, and milk.  He was diagnosed with shingles three weeks ago, with lesions located on his back. The pain and lesions have since resolved.  He smokes two small cigarettes per week and drinks alcohol very sporadically, with the last intake being two weeks ago. He works with sales promotion account executive and engages in minimal physical activity. He drinks about three liters of water a day.  His family history includes hypertension in his mother and diabetes in his father. He has no current dental complaints but is missing some teeth. He takes no prescription medications but uses Metamucil for bowel movements.       Most recent fall risk assessment:     No data to display           Most  recent depression screenings:     No data to display          Vision:Within last year and Dental: No regular dental care  and Last dental visit: over one year ago.  Patient Active Problem List   Diagnosis Date Noted   Shingles 05/14/2024   Inguinal swelling 12/16/2022   Right foot pain 06/26/2021   Swelling of first metatarsophalangeal (MTP) joint of right foot 02/13/2019   Past Medical History:  Diagnosis Date   No pertinent past medical history    Past Surgical History:  Procedure Laterality Date   INGUINAL HERNIA REPAIR Bilateral    NO PAST SURGERIES     Social History   Tobacco Use   Smoking status: Some Days    Types: Cigars, Cigarettes   Smokeless tobacco: Never  Vaping Use   Vaping status: Never Used  Substance Use Topics   Alcohol use: Yes    Comment: 2-3 q wk   Drug use: No      Patient Care Team: Lowella Benton CROME, PA as PCP - General (Physician Assistant)   Outpatient Medications Prior to Visit  Medication Sig   psyllium (METAMUCIL) 58.6 % packet Take 1 packet by mouth daily.   No facility-administered medications prior to visit.    ROS Complete 12 point ROS performed with all pertinent positives listed in HPI      Objective:  BP 120/71 (BP Location: Right Arm, Patient Position: Sitting, Cuff Size: Normal)   Pulse 70   Temp 98.3 F (36.8 C) (Oral)   Ht 5' 7 (1.702 m)   Wt 208 lb (94.3 kg)   SpO2 99%   BMI 32.58 kg/m  BP Readings from Last 3 Encounters:  06/28/24 120/71  05/14/24 (!) 136/90  12/16/22 (!) 148/81   Wt Readings from Last 3 Encounters:  06/28/24 208 lb (94.3 kg)  05/14/24 208 lb (94.3 kg)  12/16/22 211 lb (95.7 kg)      Physical Exam Vitals and nursing note reviewed. Exam conducted with a chaperone present.  Constitutional:      General: He is not in acute distress.    Appearance: Normal appearance. He is not ill-appearing, toxic-appearing or diaphoretic.  HENT:     Head: Normocephalic and atraumatic.      Right Ear: Tympanic membrane, ear canal and external ear normal. There is no impacted cerumen.     Left Ear: Tympanic membrane, ear canal and external ear normal. There is no impacted cerumen.     Nose: Nose normal.     Mouth/Throat:     Mouth: Mucous membranes are moist.     Pharynx: Oropharynx is clear. No oropharyngeal exudate or posterior oropharyngeal erythema.  Eyes:     General: No scleral icterus.       Right eye: No discharge.        Left eye: No discharge.     Extraocular Movements: Extraocular movements intact.     Pupils: Pupils are equal, round, and reactive to light.  Neck:     Thyroid: No thyroid mass, thyromegaly or thyroid tenderness.  Cardiovascular:     Rate and Rhythm: Normal rate and regular rhythm.     Pulses: Normal pulses.     Heart sounds: No murmur heard. Pulmonary:     Effort: Pulmonary effort is normal. No respiratory distress.     Breath sounds: Normal breath sounds. No stridor. No wheezing or rhonchi.  Abdominal:     General: Abdomen is flat. Bowel sounds are normal. There is no distension.     Palpations: Abdomen is soft. There is no mass.     Tenderness: There is no abdominal tenderness. There is no guarding.  Musculoskeletal:     Cervical back: Normal range of motion and neck supple. No rigidity or tenderness.     Right lower leg: No edema.     Left lower leg: No edema.  Lymphadenopathy:     Cervical: No cervical adenopathy.  Skin:    General: Skin is warm and dry.     Coloration: Skin is not jaundiced.     Findings: No bruising, erythema or rash.  Neurological:     General: No focal deficit present.     Mental Status: He is alert and oriented to person, place, and time.     Sensory: No sensory deficit.     Motor: No weakness.  Psychiatric:        Mood and Affect: Mood normal.        Behavior: Behavior normal.      No results found for any visits on 06/28/24.  Last metabolic panel Lab Results  Component Value Date   GLUCOSE 92  02/13/2019   NA 140 02/13/2019   K 3.9 02/13/2019   CL 108 02/13/2019   CO2 25 02/13/2019   BUN 11 02/13/2019   CREATININE 0.74 02/13/2019   GFRNONAA 116 02/13/2019   CALCIUM  8.8 02/13/2019   PROT 6.6 02/13/2019   BILITOT 0.3 02/13/2019   AST 28 02/13/2019   ALT 37 02/13/2019        Assessment & Plan:    Routine Health Maintenance and Physical Exam  Immunization History  Administered Date(s) Administered   Influenza, Seasonal, Injecte, Preservative Fre 06/28/2024   Janssen (J&J) SARS-COV-2 Vaccination 08/23/2018, 08/24/2019   Tdap 03/09/2024    Health Maintenance  Topic Date Due   HIV Screening  Never done   Hepatitis C Screening  Never done   COVID-19 Vaccine (3 - 2025-26 season) 07/13/2024 (Originally 04/23/2024)   Pneumococcal Vaccine (1 of 2 - PCV) 06/28/2025 (Originally 09/29/1998)   Hepatitis B Vaccines 19-59 Average Risk (1 of 3 - 19+ 3-dose series) 06/28/2025 (Originally 09/29/1998)   DTaP/Tdap/Td (2 - Td or Tdap) 03/09/2034   Influenza Vaccine  Completed   Meningococcal B Vaccine  Aged Out   HPV VACCINES  Discontinued    Discussed health benefits of physical activity, and encouraged him to engage in regular exercise appropriate for his age and condition.  Problem List Items Addressed This Visit   None Visit Diagnoses       Routine adult health maintenance    -  Primary   Relevant Orders   CBC with Differential/Platelet   Hemoglobin A1c   TSH   Lipid panel   Comprehensive metabolic panel with GFR   PSA     Immunization due       Relevant Orders   Flu vaccine trivalent PF, 6mos and older(Flulaval,Afluria,Fluarix,Fluzone) (Completed)     Encounter for screening for HIV       Relevant Orders   HIV antibody (with reflex)     Need for hepatitis C screening test       Relevant Orders   Hepatitis C Antibody     BMI 32.0-32.9,adult          Return in about 1 year (around 06/28/2025) for Annual Physical.  Assessment and Plan    Adult Wellness  Visit Annual exam completed. No significant health concerns. Family history of hypertension and diabetes. Minimal physical activity. Occasional smoking. No prostate symptoms. - Ordered lab tests including cholesterol panel. - Administered flu vaccine. - Declined HPV vaccine due to age. - Discussed PSA screening. - Sent lab results via MyChart.  General Health Maintenance Discussed health maintenance, vaccinations, and screenings. Declined pneumonia vaccine. Emphasized regular eye and dental check-ups. - Encouraged regular eye and dental check-ups.         Benton LITTIE Gave, PA

## 2024-06-28 NOTE — Patient Instructions (Signed)
 We drew your labs today and updated your flu shot.  Please return annually, sooner as needed.

## 2024-06-29 ENCOUNTER — Ambulatory Visit: Payer: Self-pay | Admitting: Urgent Care

## 2024-06-29 LAB — CBC WITH DIFFERENTIAL/PLATELET
Basophils Absolute: 0 x10E3/uL (ref 0.0–0.2)
Basos: 1 %
EOS (ABSOLUTE): 0.3 x10E3/uL (ref 0.0–0.4)
Eos: 5 %
Hematocrit: 44.1 % (ref 37.5–51.0)
Hemoglobin: 14.7 g/dL (ref 13.0–17.7)
Immature Grans (Abs): 0 x10E3/uL (ref 0.0–0.1)
Immature Granulocytes: 0 %
Lymphocytes Absolute: 1.9 x10E3/uL (ref 0.7–3.1)
Lymphs: 33 %
MCH: 29.8 pg (ref 26.6–33.0)
MCHC: 33.3 g/dL (ref 31.5–35.7)
MCV: 90 fL (ref 79–97)
Monocytes Absolute: 0.4 x10E3/uL (ref 0.1–0.9)
Monocytes: 6 %
Neutrophils Absolute: 3.2 x10E3/uL (ref 1.4–7.0)
Neutrophils: 55 %
Platelets: 179 x10E3/uL (ref 150–450)
RBC: 4.93 x10E6/uL (ref 4.14–5.80)
RDW: 13.6 % (ref 11.6–15.4)
WBC: 5.7 x10E3/uL (ref 3.4–10.8)

## 2024-06-29 LAB — COMPREHENSIVE METABOLIC PANEL WITH GFR
ALT: 28 IU/L (ref 0–44)
AST: 22 IU/L (ref 0–40)
Albumin: 4.3 g/dL (ref 4.1–5.1)
Alkaline Phosphatase: 85 IU/L (ref 47–123)
BUN/Creatinine Ratio: 13 (ref 9–20)
BUN: 13 mg/dL (ref 6–24)
Bilirubin Total: 0.2 mg/dL (ref 0.0–1.2)
CO2: 21 mmol/L (ref 20–29)
Calcium: 9.2 mg/dL (ref 8.7–10.2)
Chloride: 103 mmol/L (ref 96–106)
Creatinine, Ser: 0.98 mg/dL (ref 0.76–1.27)
Globulin, Total: 2.6 g/dL (ref 1.5–4.5)
Glucose: 100 mg/dL — ABNORMAL HIGH (ref 70–99)
Potassium: 4.1 mmol/L (ref 3.5–5.2)
Sodium: 139 mmol/L (ref 134–144)
Total Protein: 6.9 g/dL (ref 6.0–8.5)
eGFR: 98 mL/min/1.73 (ref >59)

## 2024-06-29 LAB — LIPID PANEL
Chol/HDL Ratio: 4.8 ratio (ref 0.0–5.0)
Cholesterol, Total: 187 mg/dL (ref 100–199)
HDL: 39 mg/dL — ABNORMAL LOW (ref >39)
LDL Chol Calc (NIH): 89 mg/dL (ref 0–99)
Triglycerides: 355 mg/dL — ABNORMAL HIGH (ref 0–149)
VLDL Cholesterol Cal: 59 mg/dL — ABNORMAL HIGH (ref 5–40)

## 2024-06-29 LAB — PSA: Prostate Specific Ag, Serum: 0.1 ng/mL (ref 0.0–4.0)

## 2024-06-29 LAB — TSH: TSH: 1.43 u[IU]/mL (ref 0.450–4.500)

## 2024-06-29 LAB — HEMOGLOBIN A1C
Est. average glucose Bld gHb Est-mCnc: 120 mg/dL
Hgb A1c MFr Bld: 5.8 % — ABNORMAL HIGH (ref 4.8–5.6)

## 2024-06-29 LAB — HEPATITIS C ANTIBODY: Hep C Virus Ab: NONREACTIVE

## 2024-06-29 LAB — HIV ANTIBODY (ROUTINE TESTING W REFLEX): HIV Screen 4th Generation wRfx: NONREACTIVE

## 2024-09-04 ENCOUNTER — Telehealth: Payer: Self-pay | Admitting: Urgent Care

## 2024-09-04 NOTE — Telephone Encounter (Signed)
 Copied from CRM [id number]. Topic: General - Other >> Sep 04, 2024 12:38 PM Rosaria A wrote: Reason for CRM: Nena patients wife states that the patient was last seen on 06/28/2024 and the work note that was in MyChart his employer is not accepting that note. Patients wife is wanting to know if a note can be created for her to come to the office to pick up. Please call Nena back at [phone].

## 2024-09-04 NOTE — Telephone Encounter (Signed)
 Can we please find out what pt is needing? I wrote him a note excusing his tardiness due to physical. thanks

## 2024-09-05 NOTE — Telephone Encounter (Signed)
 Spoke with Victor Sherman, read her the note that was written on 06/28/24. She asked if it could say that he was seen for a wellness visit. Advised that the letter could not give specific reasons for visits to employers. She requested to pick up a copy of the letter today. Copy was placed at the front desk for pick up.
